# Patient Record
Sex: Male | Born: 1945 | Race: White | Marital: Married | Smoking: Former smoker
Health system: Southern US, Community
[De-identification: ages and names within clinical notes are randomized; demographics above are authoritative.]

## PROBLEM LIST (undated history)

## (undated) DIAGNOSIS — E7800 Pure hypercholesterolemia, unspecified: Secondary | ICD-10-CM

## (undated) DIAGNOSIS — E049 Nontoxic goiter, unspecified: Secondary | ICD-10-CM

## (undated) DIAGNOSIS — M1990 Unspecified osteoarthritis, unspecified site: Secondary | ICD-10-CM

## (undated) DIAGNOSIS — J449 Chronic obstructive pulmonary disease, unspecified: Secondary | ICD-10-CM

## (undated) DIAGNOSIS — I10 Essential (primary) hypertension: Secondary | ICD-10-CM

## (undated) HISTORY — PX: APPENDECTOMY: SHX54

## (undated) HISTORY — PX: BACK SURGERY: SHX140

---

## 2005-06-12 ENCOUNTER — Ambulatory Visit: Payer: Self-pay | Admitting: Gastroenterology

## 2007-03-12 ENCOUNTER — Ambulatory Visit: Payer: Self-pay | Admitting: Family Medicine

## 2007-11-27 ENCOUNTER — Ambulatory Visit: Payer: Self-pay | Admitting: Orthopedic Surgery

## 2007-12-04 ENCOUNTER — Ambulatory Visit: Payer: Self-pay | Admitting: Orthopedic Surgery

## 2008-10-05 ENCOUNTER — Ambulatory Visit: Payer: Self-pay | Admitting: Unknown Physician Specialty

## 2012-01-22 ENCOUNTER — Ambulatory Visit: Payer: Self-pay | Admitting: Specialist

## 2012-01-22 LAB — MRSA PCR SCREENING

## 2012-01-22 LAB — URINALYSIS, COMPLETE
Blood: NEGATIVE
Glucose,UR: NEGATIVE mg/dL (ref 0–75)
Leukocyte Esterase: NEGATIVE
Nitrite: NEGATIVE
Protein: NEGATIVE
Specific Gravity: 1.008 (ref 1.003–1.030)

## 2012-01-22 LAB — BASIC METABOLIC PANEL
Anion Gap: 8 (ref 7–16)
Calcium, Total: 9.8 mg/dL (ref 8.5–10.1)
Chloride: 106 mmol/L (ref 98–107)
Co2: 29 mmol/L (ref 21–32)
Creatinine: 1.09 mg/dL (ref 0.60–1.30)
EGFR (African American): >60
Osmolality: 284 (ref 275–301)

## 2012-01-22 LAB — CBC
HGB: 14 g/dL (ref 13.0–18.0)
MCH: 31.5 pg (ref 26.0–34.0)
MCV: 92 fL (ref 80–100)
RBC: 4.43 10*6/uL (ref 4.40–5.90)
WBC: 5.1 10*3/uL (ref 3.8–10.6)

## 2012-01-22 LAB — PROTIME-INR: Prothrombin Time: 12.7 secs (ref 11.5–14.7)

## 2012-01-30 ENCOUNTER — Inpatient Hospital Stay: Payer: Self-pay | Admitting: Specialist

## 2012-01-30 HISTORY — PX: OTHER SURGICAL HISTORY: SHX169

## 2012-01-31 LAB — CBC WITH DIFFERENTIAL/PLATELET
Eosinophil #: 0 10*3/uL (ref 0.0–0.7)
Eosinophil %: 0.4 %
HCT: 30.7 % — ABNORMAL LOW (ref 40.0–52.0)
Lymphocyte %: 14.7 %
MCV: 92 fL (ref 80–100)
Monocyte #: 0.9 10*3/uL — ABNORMAL HIGH (ref 0.0–0.7)
Monocyte %: 10.5 %
Platelet: 151 10*3/uL (ref 150–440)
RBC: 3.36 10*6/uL — ABNORMAL LOW (ref 4.40–5.90)
WBC: 8.3 10*3/uL (ref 3.8–10.6)

## 2012-01-31 LAB — BASIC METABOLIC PANEL
BUN: 13 mg/dL (ref 7–18)
Chloride: 105 mmol/L (ref 98–107)
Glucose: 118 mg/dL — ABNORMAL HIGH (ref 65–99)
Osmolality: 282 (ref 275–301)
Potassium: 3.6 mmol/L (ref 3.5–5.1)

## 2012-02-18 ENCOUNTER — Emergency Department: Payer: Self-pay | Admitting: Emergency Medicine

## 2012-02-18 LAB — URINALYSIS, COMPLETE
Bacteria: NONE SEEN
Bilirubin,UR: NEGATIVE
Blood: NEGATIVE
Leukocyte Esterase: NEGATIVE
Nitrite: NEGATIVE
RBC,UR: <1 /HPF (ref 0–5)
Squamous Epithelial: <1
WBC UR: 1 /HPF (ref 0–5)

## 2012-02-18 LAB — COMPREHENSIVE METABOLIC PANEL
Albumin: 3.7 g/dL (ref 3.4–5.0)
BUN: 15 mg/dL (ref 7–18)
Calcium, Total: 9.8 mg/dL (ref 8.5–10.1)
Chloride: 101 mmol/L (ref 98–107)
Co2: 23 mmol/L (ref 21–32)
Creatinine: 1.46 mg/dL — ABNORMAL HIGH (ref 0.60–1.30)
EGFR (Non-African Amer.): 51 — ABNORMAL LOW
Glucose: 102 mg/dL — ABNORMAL HIGH (ref 65–99)
Potassium: 4.1 mmol/L (ref 3.5–5.1)
SGOT(AST): 27 U/L (ref 15–37)
SGPT (ALT): 21 U/L

## 2012-02-18 LAB — CBC
HCT: 33 % — ABNORMAL LOW (ref 40.0–52.0)
HGB: 10.6 g/dL — ABNORMAL LOW (ref 13.0–18.0)
MCH: 29 pg (ref 26.0–34.0)
MCHC: 32 g/dL (ref 32.0–36.0)
MCV: 91 fL (ref 80–100)
Platelet: 658 10*3/uL — ABNORMAL HIGH (ref 150–440)
RBC: 3.64 10*6/uL — ABNORMAL LOW (ref 4.40–5.90)
RDW: 13.8 % (ref 11.5–14.5)
WBC: 5.1 10*3/uL (ref 3.8–10.6)

## 2012-02-18 LAB — TROPONIN I: Troponin-I: <0.02 ng/mL

## 2012-02-20 ENCOUNTER — Other Ambulatory Visit: Payer: Self-pay

## 2012-02-20 ENCOUNTER — Emergency Department (HOSPITAL_COMMUNITY)
Admission: EM | Admit: 2012-02-20 | Discharge: 2012-02-20 | Disposition: A | Discharge status: Home / Self Care | Payer: Medicare Other | Attending: Emergency Medicine | Admitting: Emergency Medicine

## 2012-02-20 ENCOUNTER — Emergency Department (HOSPITAL_COMMUNITY): Payer: Medicare Other

## 2012-02-20 ENCOUNTER — Encounter (HOSPITAL_COMMUNITY): Payer: Self-pay | Admitting: *Deleted

## 2012-02-20 DIAGNOSIS — R5381 Other malaise: Secondary | ICD-10-CM | POA: Insufficient documentation

## 2012-02-20 DIAGNOSIS — R5383 Other fatigue: Secondary | ICD-10-CM

## 2012-02-20 DIAGNOSIS — I10 Essential (primary) hypertension: Secondary | ICD-10-CM | POA: Insufficient documentation

## 2012-02-20 DIAGNOSIS — Z9889 Other specified postprocedural states: Secondary | ICD-10-CM | POA: Insufficient documentation

## 2012-02-20 DIAGNOSIS — R110 Nausea: Secondary | ICD-10-CM

## 2012-02-20 DIAGNOSIS — R0989 Other specified symptoms and signs involving the circulatory and respiratory systems: Secondary | ICD-10-CM | POA: Insufficient documentation

## 2012-02-20 DIAGNOSIS — R1011 Right upper quadrant pain: Secondary | ICD-10-CM | POA: Insufficient documentation

## 2012-02-20 DIAGNOSIS — R0602 Shortness of breath: Secondary | ICD-10-CM | POA: Insufficient documentation

## 2012-02-20 DIAGNOSIS — E7800 Pure hypercholesterolemia, unspecified: Secondary | ICD-10-CM | POA: Insufficient documentation

## 2012-02-20 DIAGNOSIS — R0600 Dyspnea, unspecified: Secondary | ICD-10-CM

## 2012-02-20 DIAGNOSIS — R509 Fever, unspecified: Secondary | ICD-10-CM | POA: Insufficient documentation

## 2012-02-20 DIAGNOSIS — R0609 Other forms of dyspnea: Secondary | ICD-10-CM | POA: Insufficient documentation

## 2012-02-20 HISTORY — DX: Essential (primary) hypertension: I10

## 2012-02-20 HISTORY — DX: Pure hypercholesterolemia, unspecified: E78.00

## 2012-02-20 LAB — URINALYSIS, ROUTINE W REFLEX MICROSCOPIC
Glucose, UA: NEGATIVE mg/dL
Hgb urine dipstick: NEGATIVE
Ketones, ur: 15 mg/dL — AB
Protein, ur: NEGATIVE mg/dL
Urobilinogen, UA: 0.2 mg/dL (ref 0.0–1.0)

## 2012-02-20 LAB — DIFFERENTIAL
Eosinophils Absolute: 0 10*3/uL (ref 0.0–0.7)
Lymphocytes Relative: 32 % (ref 12–46)
Lymphs Abs: 1.9 10*3/uL (ref 0.7–4.0)
Monocytes Relative: 8 % (ref 3–12)
Neutrophils Relative %: 59 % (ref 43–77)

## 2012-02-20 LAB — COMPREHENSIVE METABOLIC PANEL
ALT: 12 U/L (ref 0–53)
Alkaline Phosphatase: 97 U/L (ref 39–117)
BUN: 14 mg/dL (ref 6–23)
CO2: 22 mEq/L (ref 19–32)
GFR calc Af Amer: 73 mL/min — ABNORMAL LOW (ref >90)
GFR calc non Af Amer: 63 mL/min — ABNORMAL LOW (ref >90)
Glucose, Bld: 97 mg/dL (ref 70–99)
Potassium: 3.6 mEq/L (ref 3.5–5.1)
Total Bilirubin: 0.4 mg/dL (ref 0.3–1.2)
Total Protein: 7.8 g/dL (ref 6.0–8.3)

## 2012-02-20 LAB — OCCULT BLOOD, POC DEVICE: Fecal Occult Bld: NEGATIVE

## 2012-02-20 LAB — T4, FREE: Free T4: 0.82 ng/dL (ref 0.80–1.80)

## 2012-02-20 LAB — CBC
Hemoglobin: 10.3 g/dL — ABNORMAL LOW (ref 13.0–17.0)
MCH: 29.2 pg (ref 26.0–34.0)
MCV: 89.2 fL (ref 78.0–100.0)
RBC: 3.53 MIL/uL — ABNORMAL LOW (ref 4.22–5.81)
WBC: 5.9 10*3/uL (ref 4.0–10.5)

## 2012-02-20 LAB — LIPASE, BLOOD: Lipase: 52 U/L (ref 11–59)

## 2012-02-20 LAB — GLUCOSE, CAPILLARY: Glucose-Capillary: 94 mg/dL (ref 70–99)

## 2012-02-20 LAB — TSH: TSH: 3.031 u[IU]/mL (ref 0.350–4.500)

## 2012-02-20 LAB — POCT I-STAT TROPONIN I

## 2012-02-20 MED ORDER — IOHEXOL 300 MG/ML  SOLN
100.0000 mL | Freq: Once | INTRAMUSCULAR | Status: AC | PRN
  Administered 2012-02-20: 100 mL via INTRAVENOUS

## 2012-02-20 MED ORDER — ONDANSETRON HCL 4 MG/2ML IJ SOLN
4.0000 mg | Freq: Once | INTRAMUSCULAR | Status: AC
  Administered 2012-02-20: 4 mg via INTRAVENOUS
  Filled 2012-02-20: qty 2

## 2012-02-20 MED ORDER — SODIUM CHLORIDE 0.9 % IV BOLUS (SEPSIS)
1000.0000 mL | Freq: Once | INTRAVENOUS | Status: AC
  Administered 2012-02-20: 1000 mL via INTRAVENOUS

## 2012-02-20 MED ORDER — IOHEXOL 300 MG/ML  SOLN: 20.0000 mL | INTRAMUSCULAR | Status: AC

## 2012-02-20 NOTE — ED Provider Notes (Signed)
History     CSN: [id number]  Arrival date & time 02/20/12  1526   First MD Initiated Contact with Patient 02/20/12 1534      Chief Complaint  Patient presents with  . Shortness of Breath  . Fever    (Consider location/radiation/quality/duration/timing/severity/associated sxs/prior treatment) HPI History provided by the patient and the EMS report by the RN.  66 year old male with a history of hypertension, hypercholesterolemia, and recent left knee replacement presenting with complaint of fatigue and "I just Damione't feel well."  Patient states that his surgery on his left knee was about 3 weeks ago (on February 13). No known complications with the procedure; however, patient has felt fatigued and generally weak since that time. Symptoms have been gradual, progressive, namely worse in the past 1-2 weeks, constant, and severe. Patient specifically complains of fatigue, dyspnea on exertion, night sweats, chills, malaise, constant nausea, and decreased appetite. Patient reportedly had a low-grade fever sometime after surgery and was started on antibiotics for an unknown infection source. Patient did see his orthopedic surgeon in followup approximately 10 days after the procedure with reportedly unremarkable exam. Patient's orthopedist reportedly does not believe his symptoms are due to infection of his knee. Patient was then seen approximately 2 days ago at [facility] emergency Department and had a CT PE study at that time, which was without PE or pneumonia; possible pancreatic mass. Patient does have an appointment with his orthopedist tomorrow but states he could not wait until that appointment.  No history of similar in the past.  EMS reportedly with an initial blood pressure of 98/58 but all repeats around 150/77.  Glucose was 78 PTA.   Past Medical History  Diagnosis Date  . Hypertension   . Hypercholesteremia     Past Surgical History  Procedure Date  . Back surgery   . Appendectomy   .  Left knee surgery 01/30/12    No family history on file.  History  Substance Use Topics  . Smoking status: Former Games developer  . Smokeless tobacco: Not on file  . Alcohol Use: No      Review of Systems  Constitutional: Positive for chills, diaphoresis, appetite change, fatigue and unexpected weight change (Pt has not weighed himself, but states that his "britches are becoming too big."). Negative for fever.  HENT: Negative for congestion, sore throat and rhinorrhea.   Eyes: Negative for pain and visual disturbance.  Respiratory: Positive for shortness of breath. Negative for cough and wheezing.   Cardiovascular: Negative for chest pain and palpitations.  Gastrointestinal: Positive for nausea. Negative for vomiting, abdominal pain, diarrhea and blood in stool (Pt reports black stools but is on iron supplements.).  Genitourinary: Negative for dysuria, frequency and hematuria.  Musculoskeletal: Negative for back pain and gait problem.  Skin: Positive for wound (well-healing left knee surgical wound). Negative for rash.  Neurological: Positive for weakness (generalized). Negative for dizziness, syncope, speech difficulty, numbness and headaches.  Psychiatric/Behavioral: Negative for confusion and agitation. The patient is nervous/anxious (Pt states that "his nerves" specifically led him to be seen today.).   All other systems reviewed and are negative.    Allergies  Lactose intolerance (gi) and Penicillins  Home Medications   Current Outpatient Rx  Name Route Sig Dispense Refill  . ACETAMINOPHEN 500 MG PO TABS Oral Take 1,000 mg by mouth every 6 (six) hours as needed. For pain    . AMLODIPINE BESYLATE 5 MG PO TABS Oral Take 5 mg by mouth daily.    Marland Kitchen  CEPHALEXIN 500 MG PO CAPS Oral Take 500 mg by mouth every 8 (eight) hours.    . IRON 28 MG PO TABS Oral Take 1 tablet by mouth every morning.    Marland Kitchen GABAPENTIN 400 MG PO CAPS Oral Take 400 mg by mouth 2 (two) times daily.    Marland Kitchen  HYDROCODONE-ACETAMINOPHEN 7.5-325 MG PO TABS Oral Take 1 tablet by mouth every 6 (six) hours as needed. For pain    . ONDANSETRON HCL 4 MG PO TABS Oral Take 4 mg by mouth every 8 (eight) hours as needed. For nausea    . PRAVASTATIN SODIUM 40 MG PO TABS Oral Take 40 mg by mouth daily.    . SULFAMETHOXAZOLE-TMP DS 800-160 MG PO TABS Oral Take 1 tablet by mouth 2 (two) times daily.      BP 157/66  Pulse 75  Temp(Src) 97.9 F (36.6 C) (Oral)  Resp 16  SpO2 100%  Physical Exam  Nursing note and vitals reviewed. Constitutional: He is oriented to person, place, and time.       Alert, oriented, appears fatigued but in no acute distress, recently becoming upset/tearing when asked specifically why he presented today  HENT:  Head: Normocephalic and atraumatic.  Right Ear: External ear normal.  Left Ear: External ear normal.  Nose: Nose normal.  Mouth/Throat: Oropharynx is clear and moist.  Eyes: Conjunctivae and EOM are normal. Pupils are equal, round, and reactive to light.  Neck: Normal range of motion. Neck supple.  Cardiovascular: Normal rate, regular rhythm and intact distal pulses.   No murmur heard. Pulmonary/Chest: Effort normal and breath sounds normal. No respiratory distress.  Abdominal: Soft. Bowel sounds are normal. He exhibits no distension. There is tenderness (RUQ to Rt side mild TTP without Murphy's Sn.).  Genitourinary: Guaiac negative stool.  Musculoskeletal: Normal range of motion. He exhibits no edema.  Neurological: He is alert and oriented to person, place, and time.  Skin: Skin is dry. No rash noted. He is not diaphoretic.  Psychiatric: He has a normal mood and affect. Judgment normal.    ED Course  Procedures (including critical care time)   Date: 02/20/2012  Rate: 83  Rhythm: normal sinus rhythm  QRS Axis: normal  Intervals: normal  ST/T Wave abnormalities: normal  Conduction Disutrbances: none  Old EKG Reviewed:  No prior for comparison    Labs  Reviewed  CBC - Abnormal; Notable for the following:    RBC 3.53 (*)    Hemoglobin 10.3 (*)    HCT 31.5 (*)    Platelets 472 (*)    All other components within normal limits  COMPREHENSIVE METABOLIC PANEL - Abnormal; Notable for the following:    GFR calc non Af Amer 63 (*)    GFR calc Af Amer 73 (*)    All other components within normal limits  URINALYSIS, ROUTINE W REFLEX MICROSCOPIC - Abnormal; Notable for the following:    Color, Urine AMBER (*) BIOCHEMICALS MAY BE AFFECTED BY COLOR   Bilirubin Urine SMALL (*)    Ketones, ur 15 (*)    All other components within normal limits  DIFFERENTIAL  LIPASE, BLOOD  LACTIC ACID, PLASMA  OCCULT BLOOD, POC DEVICE  POCT I-STAT TROPONIN I  GLUCOSE, CAPILLARY  URINE CULTURE  TSH  T4, FREE   Dg Chest 2 View  02/20/2012  *RADIOLOGY REPORT*  Clinical Data: Short of breath, fever.  Knee replacement 01/30/2012  CHEST - 2 VIEW  Comparison: None.  Findings: Heart size and vascularity are  normal.  Linear density in the lingula appears to be due to scarring or atelectasis.  No definite pneumonia or effusion.  Apical pleural scarring bilaterally.  IMPRESSION: Lingular atelectasis or scarring.  Negative for pneumonia.  Original Report Authenticated By: Camelia Phenes, M.D.   Ct Abdomen Pelvis W Contrast  02/20/2012  *RADIOLOGY REPORT*  Clinical Data: The right upper quadrant to pain.  History of weight loss.  Generalized weakness.  Possible pancreatic mass.  CT ABDOMEN AND PELVIS WITH CONTRAST  Technique:  Multidetector CT imaging of the abdomen and pelvis was performed following the standard protocol during bolus administration of intravenous contrast.  Contrast: OMNIPAQUE IOHEXOL 300 MG/ML IJ SOLN  Comparison: None available.  Findings: The lung bases are clear without focal nodule, mass, or airspace disease.  The heart size is normal. No significant pleural or pericardial effusion is present.  The liver and spleen are within normal limits.  The  stomach, duodenum, and pancreas are unremarkable.  The common bile duct and gallbladder are normal.  The adrenal glands are normal bilaterally. The kidneys and ureters are within normal limits.  Atherosclerotic calcifications are present in the abdominal aorta branch vessels without aneurysm.  Diverticular changes are present within the sigmoid and descending colon without focal inflammation to suggest diverticulitis.  The remainder of the colon is unremarkable.  The appendix is not discretely visualized and may be surgically absent.  The small bowel is unremarkable.  There is no significant adenopathy or free fluid.  The patient is status post L4-5 PLIF.  Previous fusion is noted at L5-S1.  Endplate degenerative changes are noted.  No focal lytic or blastic lesions are evident.  Degenerative changes are noted at the hips bilaterally. Fat is herniated into the right inguinal canal without associated bowel.  IMPRESSION:  1.  No acute or focal abnormality to explain the patient's symptoms. 2.  Sigmoid and descending colonic diverticulosis without evidence for diverticulitis. 3.  Atherosclerosis. 4.  Postoperative changes of the lower lumbar spine.  Original Report Authenticated By: Jamesetta Orleans. MATTERN, M.D.       1. Fatigue       MDM  66 y.o. M recently s/p Lt knee replacement presenting with fatigue, nausea, suspected wt loss, and DOE.  No sig fever.  Recent CT PE study without intrathoracic path but possible pancreatic lesion/mass.  Exam as above, AF/VSS, mild Rt abd TTP, clear lungs, Lt knee with well-healing surgical scar and mildly decreased ROM reportedly the same since surgery and without sig pain elicited with ROM; doubt septic jt.  ACS, CA, metabolic derangement, or hypothyroidism possible, although the latter not expected to result in weight loss.  CT abd, labs, bolus, and zofran ordered.  EKG unremarkable.  Hemoccult neg.  4:32 PM - Called pt's PCP office (Dr. Marguerite Olea of [facility]); Dr. Marguerite Olea not availabe but spoke with the RN; pt's weight on 01/22/12 = 213 lb.  Labs at that time:  Hgb 14.1, creat 1.1, NL LFTs, and TSH 6.488; prior TSH 4.752; pt referred to endocrinology for further eval of possible hypothyroidism.  Pt with a h/o thyroid surgery, not on supplements.  Hgb now 10 but pt reportedly with know post-op anemia and on iron for this.  Labs otherwise WNL; TSH and free T4 pending.  CT without report of CA or acute path to explain pt's Sx's.  Will have pt f/u with PCP for thyroid results, with endocrinology as scheduled this month, and with ortho for repeat eval.  Return precautions reviewed.        Particia Lather, MD 02/21/12 [phone]

## 2012-02-20 NOTE — ED Notes (Signed)
Pt had dizziness with sitting and standing

## 2012-02-20 NOTE — ED Notes (Signed)
Pt is from home.  Pt had left total knee surgery on 01/30/12 and site doesnot appear red or swollen and no drainage.  Pt went home with low grade fever and was placed on anbx for knee.  Since pt is fatigued, exertional dyspnea, fever, night sweats, deccreased appetite.  Ems reported 2 bps  One sitting was 98/58 and then the rest have been 150/77.  cbg-78.  Pt has been anemic since the surgery and is taking an iron pill.  Pt had CT of chest to r/o PE on Monday and was told it was negative but that there was a mass on Pancreas.

## 2012-02-20 NOTE — ED Notes (Signed)
Patient to CT.

## 2012-02-20 NOTE — ED Provider Notes (Signed)
I saw and evaluated the patient, reviewed the resident's note and I agree with the findings and plan.  Pt seen and examined. Labs and xrays reviewed. Awaiting abd ct results. Anticipate admission  Toy Baker, MD 02/20/12 1723

## 2012-02-20 NOTE — ED Notes (Signed)
Patient continues with nausea.  MD notified.

## 2012-02-20 NOTE — ED Notes (Signed)
Checked patient Javier Good it was 51 notified RN Sabrina of blood sugar

## 2012-02-20 NOTE — Discharge Instructions (Signed)

## 2012-02-21 LAB — URINE CULTURE
Colony Count: NO GROWTH
Culture: NO GROWTH

## 2012-02-22 NOTE — ED Provider Notes (Signed)
I saw and evaluated the patient, reviewed the resident's note and I agree with the findings and plan.  Toy Baker, MD 02/22/12 [phone]

## 2012-02-28 ENCOUNTER — Ambulatory Visit: Payer: Self-pay | Admitting: Gastroenterology

## 2012-03-03 ENCOUNTER — Ambulatory Visit: Payer: Self-pay | Admitting: Gastroenterology

## 2012-03-12 ENCOUNTER — Ambulatory Visit: Payer: Self-pay | Admitting: Gastroenterology

## 2012-03-17 LAB — PATHOLOGY REPORT

## 2012-11-11 IMAGING — NM NUCLEAR MEDICINE HEPATOHBILIARY INCLUDE GB
2 series · 12 of 12 positions shown · non-contrast
Comparison: none

REASON FOR EXAM: abd pain RUQ nausea weight loss
COMMENTS:

[Series 1000: gallbladder dynamic · 4.80mm/px · 6 of 60 frames shown]
[frame 6/60]
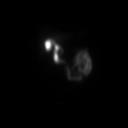
[frame 16/60]
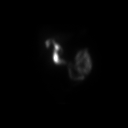
[frame 26/60]
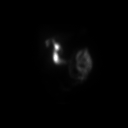
[frame 36/60]
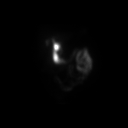
[frame 46/60]
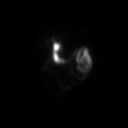
[frame 56/60]
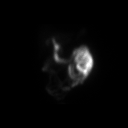

[Series 1000: gallbladder dynamic (results) · 4.80mm/px · 6 of 60 frames shown]
[frame 6/60]
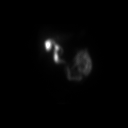
[frame 16/60]
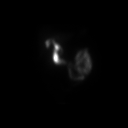
[frame 26/60]
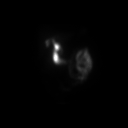
[frame 36/60]
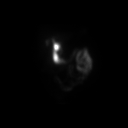
[frame 46/60]
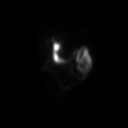
[frame 56/60]
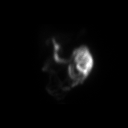

[12 of 12 positions shown; findings below may reference images not displayed]

PROCEDURE:     KNM - KNM HEPATO W/GB EJECT FRACTION  - March 03, 2012 [DATE]

RESULT:

Procedure: The patient received 8.57 mCi of technetium 99m labeled Choletec
intravenous administration. Hepatobiliary evaluation was performed with
imaging obtained of the abdomen in standard obliquities.

A gallbladder ejection fraction was measured status post intravenous
administration of 1.8 mcg of sincalide IV administered over 30 minutes.
FINDINGS: The liver demonstrates homogeneous radiotracer activity. Common
bile duct activity is identified at 10 minutes with gallbladder activity
appreciated at 15 minutes. Increased intensity is identified within the
gallbladder and common bile duct. Small bowel peristalsis and excretion is
identified at 75 minutes.

A gallbladder ejection fraction was measured at 98%.
IMPRESSION: Unremarkable hepatobiliary evaluation and gallbladder ejection fraction as
described above.

## 2013-10-23 ENCOUNTER — Ambulatory Visit: Payer: Self-pay | Admitting: Gastroenterology

## 2013-10-26 LAB — PATHOLOGY REPORT

## 2015-04-10 NOTE — Op Note (Signed)
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   PROCEDURE: Cemented left [place] LCS rotating platform total knee replacement (standard plus femur/patella, 12.5-mm polyethylene bearing, #4 keeled tibia).   SURGEON: Park Breed, M.D.   ASSISTANT: Thornton Park, M.D.   ANESTHESIA: General endotracheal plus femoral nerve block.   COMPLICATIONS: None.   DRAINS: Two Autovac.   ESTIMATED BLOOD LOSS: None.   REPLACED: None.   DESCRIPTION OF PROCEDURE: The patient was brought to the Operating Room where he underwent satisfactory general endotracheal anesthesia in the supine position.  He had a femoral nerve block on the left. He could not have a spinal due to prior back surgeries. The left leg was prepped and draped in sterile fashion and an Esmarch applied. The tourniquet was inflated to 350 mmHg. Tourniquet time was 100 minutes. Anterior midline incision was made and dissection carried out sharply through subcutaneous tissue. The capsule was incised and soft tissue dissection was carried out thoroughly. The proximal tibial alignment guide was inserted and pinned in appropriate position. The proximal tibial cut was made, taking more bone laterally than medially. The ligaments were released somewhat medially to balance the ligaments. Distal femur was sized as a standard plus. A centering hole was made and an anterior cutting block inserted. The rotation guide was inserted and stable at 12.5 mm with good alignment. The anterior cutting block was pinned in place and the anterior and posterior cuts were made. The distal femoral cutting block 4 degrees valgus was inserted and the distal femoral cuts made. Finishing block was applied and the cuts made. The tibia was approached and a  centering hole made. The trial was pinned in place and the keel inserted. The extension gap had been tested at 12.5 and was quite stable with good alignment. A 12.5 trial poly was inserted along with a standard plus femur. The knee articulated nicely and had good alignment and good motion and good stability. The patella was cut and drilled and trial inserted. This tracked well. The trials were all removed and the joint thoroughly irrigated, debrided, and dried while cement was mixed. The #4 keeled tibia was cemented in place along with a standard plus femur and patella. Excess cement was removed. A 12.5-mm rotating platform polyethylene was inserted. The knee articulated well with excellent alignment and stability. After all cemented had hardened, the wound was thoroughly irrigated again. Bone wax was placed on raw surfaces.  60 mL of 0.25% Marcaine with epinephrine, 4 mg of morphine, and 30 mg of Toradol was injected into the soft tissues. The Autovacs were inserted. The capsule was closed with #2 Ortho cord sutures. The subcutaneous tissue was closed with 2-0 Vicryl and skin was closed with staples. A dry sterile dressing was applied and Autovac was activated. The TENS pads, Polar Care, and knee immobilizer were applied. The patient was awakened and transferred to his hospital bed and taken to recovery in good condition.  ____________________________ Park Breed, MD hem:bjt D: 01/30/2012 10:17:38 ET T: 01/30/2012 11:31:42 ET JOB#: [id number]  cc: Park Breed, MD, <Dictator> Park Breed MD ELECTRONICALLY SIGNED 01/31/2012 13:29

## 2015-04-10 NOTE — H&P (Signed)
    Subjective/Chief Complaint Pain left knee    History of Present Illness 69 year old male has had progressive osteoarthritis of the left knee for several years.  Has failed injections and bracing.  Cannot take NSAIDs.  X-rays show advanced osteoarthritis of the knee with bone on bone medially.  Requests left total knee replacement.  Risks and benefits of surgery were discussed at length including but not limited to infection, non union, nerve or blood vessed damage, non union, need for repeat surgery, blood clots and lung emboli, and death.. Wife present and understands as well.   Past Med/Surgical Hx:  Arthritis:   Hypertension:   Right Knee Arthroscopy:   Left Knee Arthroscopy:   Lumbar Fusion:   Tonsillectomy:   Appendectomy:   ALLERGIES:  PCN: Hives  Milk: N/V/Diarrhea, Other  HOME MEDICATIONS: Medication Instructions Status  pravastatin 40 mg oral tablet 1 tab(s) orally once a day (at bedtime) Active  amlodipine 5 mg oral tablet 1 tab(s) orally once a day (in the morning) Active  aspirin 81 mg oral tablet 1 tab(s) orally once a day (at bedtime) Active  Tylenol Caplet Extra Strength 500 mg oral tablet 1 tab(s) orally every 6 hours as needed Active  Fish Oil 1200 mg oral capsule 2 cap(s) orally once a day Active  multivitamin 1 cap(s) orally once a day Active   Family and Social History:   Family History Non-Contributory    Social History negative tobacco    Place of Living Home   Review of Systems:   Fever/Chills No    Cough No    Sputum No    Abdominal Pain No   Physical Exam:   GEN WD, WN, NAD    HEENT pink conjunctivae    NECK supple    RESP normal resp effort    CARD regular rate    ABD denies tenderness    LYMPH negative neck    EXTR negative edema, Left knee in varus.  circulation/sensation/motor function good.  range of motion 5-100*.  Medial joint line pain.    SKIN normal to palpation    NEURO motor/sensory function intact    PSYCH  alert, A+O to time, place, person     Assessment/Admission Diagnosis Left  knee osteoarthritis    Plan Left total knee replacement   Electronic Signatures: Park Breed (MD)  (Signed 13-Feb-13 07:30)  Authored: CHIEF COMPLAINT and HISTORY, PAST MEDICAL/SURGIAL HISTORY, ALLERGIES, HOME MEDICATIONS, FAMILY AND SOCIAL HISTORY, REVIEW OF SYSTEMS, PHYSICAL EXAM, ASSESSMENT AND PLAN   Last Updated: 13-Feb-13 07:30 by Park Breed (MD)

## 2015-04-10 NOTE — Discharge Summary (Signed)
PATIENT NAME:  Javier Good, Javier Good MR#:  [id number] DATE OF BIRTH:  [birth year]  DATE OF ADMISSION:  01/30/2012 DATE OF DISCHARGE:  02/01/2012  FINAL DIAGNOSES:  1. Advanced osteoarthritis of left knee.  2. Hypertension.  3. High cholesterol.  PROCEDURE: On 01/30/2012 cemented left [place] rotating platform LCS total knee replacement.   COMPLICATIONS: None.   CONSULTATIONS: None.   DISCHARGE MEDICATIONS:  1. Norco 7.5/325 mg 1 to 2 every six hours p.r.n. pain.  2. Enteric-coated aspirin one p.o. twice a day. 3. Neurontin 400 mg twice a day. 4. Home medications as prior to admission.   HISTORY OF PRESENT ILLNESS: The patient is 69 year old male with advanced osteoarthritis of the left knee. He has been followed for some time with injections and bracing. He cannot take NSAIDs. He has reached the point where he has daily pain and difficulty with activities of daily living and sleep. He requested total knee replacement. The risks and benefits and postoperative protocols were discussed with the patient and his wife at length.   PAST MEDICAL HISTORY: As above.   PAST SURGICAL HISTORY:  1. Arthroscopy, both knees. 2. Lumbar fusion. 3. Tonsillectomy.  4. Appendectomy.   ALLERGIES: Penicillin causes hives. Milk causes diarrhea.  HOME MEDICATIONS: 1. Pravastatin 40 mg daily. 2. Amlodipine 5 mg daily. 3. Aspirin 81 mg daily. 4. Tylenol as needed. 5. Fish oil. 6. Vitamins.  FAMILY HISTORY: Unremarkable.   SOCIAL HISTORY: The patient does not smoke. He lives at home with his wife.  REVIEW OF SYSTEMS: Unremarkable.   PHYSICAL EXAMINATION: Vital signs were normal. The patient was alert and cooperative and fully oriented. The left knee showed tenderness medially with spurring. There was motion from 5 to 100 degrees. The knee was stable. There was minimal effusion. Neurovascular status was good distally and the skin was intact.   LABORATORY DATA: Laboratory data on admission was  satisfactory.   HOSPITAL COURSE: On 01/30/2012, the patient underwent cemented left total knee replacement. He did well postoperatively with no significant pain or problems. The hemoglobin remained stable and was 9.5 on the second postoperative day. He was ambulatory and pain was controlled. He was discharged on 02/01/2012 to home with partial weight-bearing on the left leg. He will get home health physical therapy. He will be seen in my office in 10 days to 2 weeks for exam.  ____________________________ Park Breed, MD hem:slb D: 02/07/2012 10:30:00 ET     T: 02/07/2012 11:54:26 ET         JOB#: [id number] cc: Fara Olden B. Jacqualine Code, MD Park Breed, MD, <Dictator> Park Breed MD ELECTRONICALLY SIGNED 02/08/2012 10:16

## 2017-04-01 ENCOUNTER — Other Ambulatory Visit: Payer: Self-pay | Admitting: Internal Medicine

## 2017-04-01 DIAGNOSIS — S0990XS Unspecified injury of head, sequela: Secondary | ICD-10-CM

## 2017-04-04 ENCOUNTER — Ambulatory Visit
Admission: RE | Admit: 2017-04-04 | Discharge: 2017-04-04 | Disposition: A | Discharge status: Home / Self Care | Payer: Medicare Other | Source: Ambulatory Visit | Attending: Internal Medicine | Admitting: Internal Medicine

## 2017-04-04 DIAGNOSIS — S0990XS Unspecified injury of head, sequela: Secondary | ICD-10-CM | POA: Diagnosis not present

## 2017-04-04 DIAGNOSIS — X58XXXS Exposure to other specified factors, sequela: Secondary | ICD-10-CM | POA: Diagnosis not present

## 2019-01-09 ENCOUNTER — Encounter: Payer: Self-pay | Admitting: *Deleted

## 2019-01-12 ENCOUNTER — Ambulatory Visit: Payer: Medicare HMO | Admitting: Anesthesiology

## 2019-01-12 ENCOUNTER — Ambulatory Visit
Admission: RE | Admit: 2019-01-12 | Discharge: 2019-01-12 | Disposition: A | Discharge status: Home / Self Care | Payer: Medicare HMO | Attending: Gastroenterology | Admitting: Gastroenterology

## 2019-01-12 ENCOUNTER — Encounter
Admission: RE | Disposition: A | Discharge status: Home / Self Care | Payer: Self-pay | Source: Home / Self Care | Attending: Gastroenterology

## 2019-01-12 ENCOUNTER — Encounter: Payer: Self-pay | Admitting: Anesthesiology

## 2019-01-12 DIAGNOSIS — E7800 Pure hypercholesterolemia, unspecified: Secondary | ICD-10-CM | POA: Diagnosis not present

## 2019-01-12 DIAGNOSIS — I10 Essential (primary) hypertension: Secondary | ICD-10-CM | POA: Insufficient documentation

## 2019-01-12 DIAGNOSIS — Z86010 Personal history of colonic polyps: Secondary | ICD-10-CM | POA: Diagnosis not present

## 2019-01-12 DIAGNOSIS — D123 Benign neoplasm of transverse colon: Secondary | ICD-10-CM | POA: Insufficient documentation

## 2019-01-12 DIAGNOSIS — Z1211 Encounter for screening for malignant neoplasm of colon: Secondary | ICD-10-CM | POA: Diagnosis not present

## 2019-01-12 DIAGNOSIS — K641 Second degree hemorrhoids: Secondary | ICD-10-CM | POA: Insufficient documentation

## 2019-01-12 DIAGNOSIS — E739 Lactose intolerance, unspecified: Secondary | ICD-10-CM | POA: Insufficient documentation

## 2019-01-12 DIAGNOSIS — E049 Nontoxic goiter, unspecified: Secondary | ICD-10-CM | POA: Insufficient documentation

## 2019-01-12 DIAGNOSIS — J449 Chronic obstructive pulmonary disease, unspecified: Secondary | ICD-10-CM | POA: Diagnosis not present

## 2019-01-12 DIAGNOSIS — Z79899 Other long term (current) drug therapy: Secondary | ICD-10-CM | POA: Insufficient documentation

## 2019-01-12 DIAGNOSIS — Z880 Allergy status to penicillin: Secondary | ICD-10-CM | POA: Diagnosis not present

## 2019-01-12 DIAGNOSIS — M1990 Unspecified osteoarthritis, unspecified site: Secondary | ICD-10-CM | POA: Diagnosis not present

## 2019-01-12 DIAGNOSIS — Z87891 Personal history of nicotine dependence: Secondary | ICD-10-CM | POA: Diagnosis not present

## 2019-01-12 DIAGNOSIS — K621 Rectal polyp: Secondary | ICD-10-CM | POA: Insufficient documentation

## 2019-01-12 HISTORY — DX: Nontoxic goiter, unspecified: E04.9

## 2019-01-12 HISTORY — DX: Unspecified osteoarthritis, unspecified site: M19.90

## 2019-01-12 HISTORY — PX: COLONOSCOPY WITH PROPOFOL: SHX5780

## 2019-01-12 HISTORY — DX: Chronic obstructive pulmonary disease, unspecified: J44.9

## 2019-01-12 SURGERY — COLONOSCOPY WITH PROPOFOL
Anesthesia: General

## 2019-01-12 MED ORDER — MIDAZOLAM HCL 2 MG/2ML IJ SOLN
INTRAMUSCULAR | Status: AC
  Filled 2019-01-12: qty 2

## 2019-01-12 MED ORDER — PROPOFOL 10 MG/ML IV BOLUS
INTRAVENOUS | Status: DC | PRN
  Administered 2019-01-12: 50 mg via INTRAVENOUS
  Administered 2019-01-12: 30 mg via INTRAVENOUS

## 2019-01-12 MED ORDER — PROPOFOL 500 MG/50ML IV EMUL
INTRAVENOUS | Status: DC | PRN
  Administered 2019-01-12: 150 ug/kg/min via INTRAVENOUS

## 2019-01-12 MED ORDER — SODIUM CHLORIDE 0.9 % IV SOLN: INTRAVENOUS | Status: DC

## 2019-01-12 MED ORDER — FENTANYL CITRATE (PF) 100 MCG/2ML IJ SOLN
INTRAMUSCULAR | Status: AC
  Filled 2019-01-12: qty 2

## 2019-01-12 MED ORDER — SODIUM CHLORIDE 0.9 % IV SOLN
INTRAVENOUS | Status: DC
  Administered 2019-01-12: 1000 mL via INTRAVENOUS

## 2019-01-12 MED ORDER — LIDOCAINE HCL (PF) 2 % IJ SOLN
INTRAMUSCULAR | Status: AC
  Filled 2019-01-12: qty 10

## 2019-01-12 MED ORDER — PHENYLEPHRINE HCL 10 MG/ML IJ SOLN
INTRAMUSCULAR | Status: DC | PRN
  Administered 2019-01-12 (×3): 100 ug via INTRAVENOUS

## 2019-01-12 MED ORDER — FENTANYL CITRATE (PF) 100 MCG/2ML IJ SOLN
INTRAMUSCULAR | Status: DC | PRN
  Administered 2019-01-12: 25 ug via INTRAVENOUS

## 2019-01-12 MED ORDER — PROPOFOL 10 MG/ML IV BOLUS
INTRAVENOUS | Status: AC
  Filled 2019-01-12: qty 20

## 2019-01-12 MED ORDER — PROPOFOL 500 MG/50ML IV EMUL
INTRAVENOUS | Status: AC
  Filled 2019-01-12: qty 50

## 2019-01-12 MED ORDER — EPHEDRINE SULFATE 50 MG/ML IJ SOLN
INTRAMUSCULAR | Status: AC
  Filled 2019-01-12: qty 1

## 2019-01-12 MED ORDER — EPHEDRINE SULFATE 50 MG/ML IJ SOLN
INTRAMUSCULAR | Status: DC | PRN
  Administered 2019-01-12 (×2): 10 mg via INTRAVENOUS

## 2019-01-12 NOTE — Anesthesia Postprocedure Evaluation (Signed)
Anesthesia Post Note  Patient: Javier Good  Procedure(s) Performed: COLONOSCOPY WITH PROPOFOL (N/A )  Patient location during evaluation: PACU Anesthesia Type: General Level of consciousness: awake and alert and oriented Pain management: pain level controlled Vital Signs Assessment: post-procedure vital signs reviewed and stable Respiratory status: spontaneous breathing Cardiovascular status: blood pressure returned to baseline Anesthetic complications: no     Last Vitals:  Vitals:   01/12/19 0854 01/12/19 0907  BP: 125/63 128/62  Pulse:    Resp:    Temp:    SpO2:      Last Pain:  Vitals:   01/12/19 0923  TempSrc:   PainSc: 0-No pain                 Sura Canul

## 2019-01-12 NOTE — H&P (Signed)
Outpatient short stay form Pre-procedure 01/12/2019 7:29 AM Lollie Sails MD  Primary Physician: Dr. Harrel Lemon  Reason for visit: Colonoscopy  History of present illness: Patient is a 73 year old male presenting today for a colonoscopy in regards to his personal history of colon polyps.  His last colonoscopy was 10/23/2013.  He tolerated his prep well.  He takes no aspirin or blood thinning agent.    Current Facility-Administered Medications:  .  0.9 %  sodium chloride infusion, , Intravenous, Continuous, Lollie Sails, MD, Last Rate: 20 mL/hr at 01/12/19 0706, 1,000 mL at 01/12/19 0706 .  0.9 %  sodium chloride infusion, , Intravenous, Continuous, Lollie Sails, MD  Medications Prior to Admission  Medication Sig Dispense Refill Last Dose  . acetaminophen (TYLENOL) 500 MG tablet Take 1,000 mg by mouth every 6 (six) hours as needed. For pain   01/11/2019 at Unknown time  . amLODipine (NORVASC) 5 MG tablet Take 5 mg by mouth daily.   01/12/2019 at 0430  . cephALEXin (KEFLEX) 500 MG capsule Take 500 mg by mouth every 8 (eight) hours.   01/11/2019 at Unknown time  . ondansetron (ZOFRAN) 4 MG tablet Take 4 mg by mouth every 8 (eight) hours as needed. For nausea   Past Week at Unknown time  . pravastatin (PRAVACHOL) 40 MG tablet Take 40 mg by mouth daily.   01/11/2019 at Unknown time  . Ferrous Sulfate (IRON) 28 MG TABS Take 1 tablet by mouth every morning.   Completed Course at Unknown time  . gabapentin (NEURONTIN) 400 MG capsule Take 400 mg by mouth 2 (two) times daily.   Completed Course at Unknown time  . HYDROcodone-acetaminophen (NORCO) 7.5-325 MG per tablet Take 1 tablet by mouth every 6 (six) hours as needed. For pain   Completed Course at Unknown time  . sulfamethoxazole-trimethoprim (BACTRIM DS) 800-160 MG per tablet Take 1 tablet by mouth 2 (two) times daily.   Completed Course at Unknown time     Allergies  Allergen Reactions  . Lactose Intolerance (Gi)   .  Penicillins Other (See Comments)    unknown     Past Medical History:  Diagnosis Date  . Arthritis    osteo  . COPD (chronic obstructive pulmonary disease) ([place])   . Hypercholesteremia   . Hypertension   . Thyroid goiter     Review of systems:      Physical Exam    Heart and lungs: Regular rate and rhythm without rub or gallop, lungs are bilaterally clear.    HEENT: Normocephalic atraumatic eyes are anicteric    Other:    Pertinant exam for procedure: Soft nontender nondistended bowel sounds positive normoactive    Planned proceedures: Colonoscopy and indicated procedures. I have discussed the risks benefits and complications of procedures to include not limited to bleeding, infection, perforation and the risk of sedation and the patient wishes to proceed.    Lollie Sails, MD Gastroenterology 01/12/2019  7:29 AM

## 2019-01-12 NOTE — Anesthesia Post-op Follow-up Note (Signed)
Anesthesia QCDR form completed.        

## 2019-01-12 NOTE — Op Note (Signed)
[facility] Gastroenterology Patient Name: Javier Good Procedure Date: 01/12/2019 7:33 AM MRN: [id number] Account #: [id number] Date of Birth: 1946/10/16 Admit Type: Outpatient Age: 73 Room: [facility] ENDO ROOM 3 Gender: Male Note Status: Finalized Procedure:            Colonoscopy Indications:          Personal history of colonic polyps Providers:            Lollie Sails, MD Referring MD:         Baxter Hire, MD (Referring MD) Medicines:            Monitored Anesthesia Care Complications:        No immediate complications. Procedure:            Pre-Anesthesia Assessment:                       - ASA Grade Assessment: III - A patient with severe                        systemic disease.                       After obtaining informed consent, the colonoscope was                        passed under direct vision. Throughout the procedure,                        the patient's blood pressure, pulse, and oxygen                        saturations were monitored continuously. The                        Colonoscope was introduced through the anus and                        advanced to the the cecum, identified by appendiceal                        orifice and ileocecal valve. The quality of the bowel                        preparation was good. Findings:      A 2 mm polyp was found in the rectum. The polyp was sessile. The polyp       was removed with a cold biopsy forceps. Resection and retrieval were       complete.      Three sessile polyps were found in the splenic flexure. The polyps were       3 to 6 mm in size. These polyps were removed with a cold snare and cold       forcep. Resection and retrieval were complete.      Many small and large-mouthed diverticula were found in the sigmoid       colon, descending colon, transverse colon and ascending colon.      External and internal hemorrhoids were found during retroflexion, during       digital exam and  during anoscopy. The hemorrhoids were small and Grade       II (internal hemorrhoids that prolapse but  reduce spontaneously). Impression:           - One 2 mm polyp in the rectum, removed with a cold                        biopsy forceps. Resected and retrieved.                       - Three 3 to 6 mm polyps at the splenic flexure,                        removed with a cold snare. Resected and retrieved. Recommendation:       - Await pathology results.                       - Telephone GI clinic for pathology results in 1 week. Procedure Code(s):    --- Professional ---                       [phone], Colonoscopy, flexible; with removal of tumor(s),                        polyp(s), or other lesion(s) by snare technique                       45380, 82, Colonoscopy, flexible; with biopsy, single                        or multiple Diagnosis Code(s):    --- Professional ---                       K62.1, Rectal polyp                       D12.3, Benign neoplasm of transverse colon (hepatic                        flexure or splenic flexure)                       Z86.010, Personal history of colonic polyps CPT copyright 2018 [employer]. All rights reserved. The codes documented in this report are preliminary and upon coder review may  be revised to meet current compliance requirements. Lollie Sails, MD 01/12/2019 8:15:59 AM This report has been signed electronically. Number of Addenda: 0 Note Initiated On: 01/12/2019 7:33 AM Scope Withdrawal Time: 0 hours 18 minutes 57 seconds  Total Procedure Duration: 0 hours 28 minutes 59 seconds       [facility]

## 2019-01-12 NOTE — Transfer of Care (Signed)
Immediate Anesthesia Transfer of Care Note  Patient: Javier Good  Procedure(s) Performed: COLONOSCOPY WITH PROPOFOL (N/A )  Patient Location: PACU  Anesthesia Type:General  Level of Consciousness:   Airway & Oxygen Therapy: Patient Spontanous Breathing and Patient connected to nasal cannula oxygen  Post-op Assessment: Report given to RN and Post -op Vital signs reviewed and stable  Post vital signs: Reviewed and stable  Last Vitals:  Vitals Value Taken Time  BP 86/47 01/12/2019  8:19 AM  Temp 36.1 C 01/12/2019  8:18 AM  Pulse 70 01/12/2019  8:20 AM  Resp 11 01/12/2019  8:20 AM  SpO2 99 % 01/12/2019  8:20 AM  Vitals shown include unvalidated device data.  Last Pain:  Vitals:   01/12/19 0818  TempSrc: Tympanic  PainSc: 0-No pain         Complications: No apparent anesthesia complications

## 2019-01-12 NOTE — Anesthesia Preprocedure Evaluation (Addendum)
Anesthesia Evaluation  Patient identified by MRN, date of birth, ID band Patient awake    Reviewed: Allergy & Precautions, NPO status , Patient's Chart, lab work & pertinent test results  Airway Mallampati: III  TM Distance: <3 FB     Dental  (+) Teeth Intact, Caps   Pulmonary COPD, former smoker,    Pulmonary exam normal        Cardiovascular hypertension, Pt. on medications Normal cardiovascular exam     Neuro/Psych negative psych ROS   GI/Hepatic negative GI ROS, Neg liver ROS,   Endo/Other    Renal/GU negative Renal ROS  negative genitourinary   Musculoskeletal  (+) Arthritis , Osteoarthritis,    Abdominal Normal abdominal exam  (+)   Peds negative pediatric ROS (+)  Hematology negative hematology ROS (+)   Anesthesia Other Findings Past Medical History: No date: Arthritis     Comment:  osteo No date: COPD (chronic obstructive pulmonary disease) ([facility]) No date: Hypercholesteremia No date: Hypertension No date: Thyroid goiter  Reproductive/Obstetrics                            Anesthesia Physical Anesthesia Plan  ASA: III  Anesthesia Plan: General   Post-op Pain Management:    Induction: Intravenous  PONV Risk Score and Plan: TIVA  Airway Management Planned: Nasal Cannula  Additional Equipment:   Intra-op Plan:   Post-operative Plan:   Informed Consent: I have reviewed the patients History and Physical, chart, labs and discussed the procedure including the risks, benefits and alternatives for the proposed anesthesia with the patient or authorized representative who has indicated his/her understanding and acceptance.     Dental advisory given  Plan Discussed with: CRNA and Surgeon  Anesthesia Plan Comments:         Anesthesia Quick Evaluation

## 2019-01-14 ENCOUNTER — Encounter: Payer: Self-pay | Admitting: Gastroenterology

## 2019-01-14 LAB — SURGICAL PATHOLOGY

## 2021-08-15 ENCOUNTER — Other Ambulatory Visit: Payer: Self-pay

## 2021-08-15 ENCOUNTER — Encounter: Payer: Self-pay | Admitting: Podiatry

## 2021-08-15 ENCOUNTER — Ambulatory Visit (INDEPENDENT_AMBULATORY_CARE_PROVIDER_SITE_OTHER): Payer: Medicare HMO

## 2021-08-15 ENCOUNTER — Ambulatory Visit: Payer: Medicare HMO | Admitting: Podiatry

## 2021-08-15 DIAGNOSIS — M722 Plantar fascial fibromatosis: Secondary | ICD-10-CM

## 2021-08-15 NOTE — Progress Notes (Signed)
[name]  Subjective:  Patient ID: Javier Good, male    DOB: 07-24-1946,  MRN: [id number]  Chief Complaint  Patient presents with   Foot Pain    Left heel pain     75 y.o. male presents with the above complaint.  Patient presents with complaint left heel pain that has been going on for quite some time.  Patient states when walking causing discomfort.  He is got 8 or 9 out of 10scale.  Is been on for few months has progressed to gotten worse.  He has not seen MRIs prior to seeing me.  He denies any other acute complaints.  He had history of Planter fasciitis for which he was treated with injection.   Review of Systems: Negative except as noted in the HPI. Denies N/V/F/Ch.  Past Medical History:  Diagnosis Date   Arthritis    osteo   COPD (chronic obstructive pulmonary disease) ([facility])    Hypercholesteremia    Hypertension    Thyroid goiter     Current Outpatient Medications:    acetaminophen (TYLENOL) 500 MG tablet, Take 1,000 mg by mouth every 6 (six) hours as needed. For pain, Disp: , Rfl:    amLODipine (NORVASC) 5 MG tablet, Take 5 mg by mouth daily., Disp: , Rfl:    cephALEXin (KEFLEX) 500 MG capsule, Take 500 mg by mouth every 8 (eight) hours., Disp: , Rfl:    Ferrous Sulfate (IRON) 28 MG TABS, Take 1 tablet by mouth every morning., Disp: , Rfl:    gabapentin (NEURONTIN) 400 MG capsule, Take 400 mg by mouth 2 (two) times daily., Disp: , Rfl:    HYDROcodone-acetaminophen (NORCO) 7.5-325 MG per tablet, Take 1 tablet by mouth every 6 (six) hours as needed. For pain, Disp: , Rfl:    ondansetron (ZOFRAN) 4 MG tablet, Take 4 mg by mouth every 8 (eight) hours as needed. For nausea, Disp: , Rfl:    pravastatin (PRAVACHOL) 40 MG tablet, Take 40 mg by mouth daily., Disp: , Rfl:    sulfamethoxazole-trimethoprim (BACTRIM DS) 800-160 MG per tablet, Take 1 tablet by mouth 2 (two) times daily., Disp: , Rfl:   Social History   Tobacco Use  Smoking Status Former  Smokeless Tobacco Not on file     Allergies  Allergen Reactions   Lactose Intolerance (Gi)    Penicillins Other (See Comments)    unknown   Objective:  There were no vitals filed for this visit. There is no height or weight on file to calculate BMI. Constitutional Well developed. Well nourished.  Vascular Dorsalis pedis pulses palpable bilaterally. Posterior tibial pulses palpable bilaterally. Capillary refill normal to all digits.  No cyanosis or clubbing noted. Pedal hair growth normal.  Neurologic Normal speech. Oriented to person, place, and time. Epicritic sensation to light touch grossly present bilaterally.  Dermatologic Nails well groomed and normal in appearance. No open wounds. No skin lesions.  Orthopedic: Normal joint ROM without pain or crepitus bilaterally. No visible deformities. Tender to palpation at the calcaneal tuber left. No pain with calcaneal squeeze left. Ankle ROM diminished range of motion left. Silfverskiold Test: positive left.   Radiographs: Taken and reviewed. No acute fractures or dislocations. No evidence of stress fracture.  Plantar heel spur present. Posterior heel spur present.   Assessment:   1. Plantar fasciitis of left foot    Plan:  Patient was evaluated and treated and all questions answered.  Plantar Fasciitis, left - XR reviewed as above.  - Educated on  icing and stretching. Instructions given.  - Injection delivered to the plantar fascia as below. - DME: Plantar Fascial Brace - Pharmacologic management: None  Procedure: Injection Tendon/Ligament Location: Left plantar fascia at the glabrous junction; medial approach. Skin Prep: alcohol Injectate: 0.5 cc 0.5% marcaine plain, 0.5 cc of 1% Lidocaine, 0.5 cc kenalog 10. Disposition: Patient tolerated procedure well. Injection site dressed with a band-aid.  No follow-ups on file.

## 2021-09-19 ENCOUNTER — Other Ambulatory Visit: Payer: Self-pay

## 2021-09-19 ENCOUNTER — Ambulatory Visit (INDEPENDENT_AMBULATORY_CARE_PROVIDER_SITE_OTHER): Payer: Medicare HMO | Admitting: Podiatry

## 2021-09-19 ENCOUNTER — Encounter: Payer: Self-pay | Admitting: Podiatry

## 2021-09-19 ENCOUNTER — Encounter (INDEPENDENT_AMBULATORY_CARE_PROVIDER_SITE_OTHER): Payer: Self-pay

## 2021-09-19 DIAGNOSIS — M722 Plantar fascial fibromatosis: Secondary | ICD-10-CM

## 2021-09-19 NOTE — Progress Notes (Signed)
[name]  Subjective:  Patient ID: Javier Good, male    DOB: 03/26/1946,  MRN: [id number]  Chief Complaint  Patient presents with   Plantar Fasciitis    "It still bothers me in the mornings but its a lot better"    75 y.o. male presents with the above complaint.  Patient presents for follow-up to left Planter fasciitis.  Patient states is about 70 to 80% better.  He states he still hurts with ambulation.  He denies any other acute complaints her first injection helped considerably.  The brace did not help.  He would like to discuss other treatment options.   Review of Systems: Negative except as noted in the HPI. Denies N/V/F/Ch.  Past Medical History:  Diagnosis Date   Arthritis    osteo   COPD (chronic obstructive pulmonary disease) ([facility])    Hypercholesteremia    Hypertension    Thyroid goiter     Current Outpatient Medications:    acetaminophen (TYLENOL) 500 MG tablet, Take 1,000 mg by mouth every 6 (six) hours as needed. For pain, Disp: , Rfl:    amLODipine (NORVASC) 5 MG tablet, Take 5 mg by mouth daily., Disp: , Rfl:    cephALEXin (KEFLEX) 500 MG capsule, Take 500 mg by mouth every 8 (eight) hours., Disp: , Rfl:    Ferrous Sulfate (IRON) 28 MG TABS, Take 1 tablet by mouth every morning., Disp: , Rfl:    gabapentin (NEURONTIN) 400 MG capsule, Take 400 mg by mouth 2 (two) times daily., Disp: , Rfl:    HYDROcodone-acetaminophen (NORCO) 7.5-325 MG per tablet, Take 1 tablet by mouth every 6 (six) hours as needed. For pain, Disp: , Rfl:    ondansetron (ZOFRAN) 4 MG tablet, Take 4 mg by mouth every 8 (eight) hours as needed. For nausea, Disp: , Rfl:    pravastatin (PRAVACHOL) 40 MG tablet, Take 40 mg by mouth daily., Disp: , Rfl:    sulfamethoxazole-trimethoprim (BACTRIM DS) 800-160 MG per tablet, Take 1 tablet by mouth 2 (two) times daily., Disp: , Rfl:   Social History   Tobacco Use  Smoking Status Former  Smokeless Tobacco Not on file    Allergies  Allergen Reactions    Lactose Other (See Comments)   Lactose Intolerance (Gi)    Penicillins Other (See Comments)    unknown   Objective:  There were no vitals filed for this visit. There is no height or weight on file to calculate BMI. Constitutional Well developed. Well nourished.  Vascular Dorsalis pedis pulses palpable bilaterally. Posterior tibial pulses palpable bilaterally. Capillary refill normal to all digits.  No cyanosis or clubbing noted. Pedal hair growth normal.  Neurologic Normal speech. Oriented to person, place, and time. Epicritic sensation to light touch grossly present bilaterally.  Dermatologic Nails well groomed and normal in appearance. No open wounds. No skin lesions.  Orthopedic: Normal joint ROM without pain or crepitus bilaterally. No visible deformities. Tender to palpation at the calcaneal tuber left. No pain with calcaneal squeeze left. Ankle ROM diminished range of motion left. Silfverskiold Test: positive left.   Radiographs: Taken and reviewed. No acute fractures or dislocations. No evidence of stress fracture.  Plantar heel spur present. Posterior heel spur present.   Assessment:   1. Plantar fasciitis of left foot     Plan:  Patient was evaluated and treated and all questions answered.  Plantar Fasciitis, left - XR reviewed as above.  - Educated on icing and stretching. Instructions given.  -Second injection delivered  to the plantar fascia as below. - DME: Plantar Fascial Brace - Pharmacologic management: None -I discussed with the patient power steps insoles for prevention.  Procedure: Injection Tendon/Ligament Location: Left plantar fascia at the glabrous junction; medial approach. Skin Prep: alcohol Injectate: 0.5 cc 0.5% marcaine plain, 0.5 cc of 1% Lidocaine, 0.5 cc kenalog 10. Disposition: Patient tolerated procedure well. Injection site dressed with a band-aid.  No follow-ups on file.

## 2021-10-13 DIAGNOSIS — Z23 Encounter for immunization: Secondary | ICD-10-CM

## 2021-10-17 ENCOUNTER — Encounter: Payer: Self-pay | Admitting: Podiatry

## 2021-10-17 ENCOUNTER — Other Ambulatory Visit: Payer: Self-pay

## 2021-10-17 ENCOUNTER — Ambulatory Visit: Payer: Medicare HMO | Admitting: Podiatry

## 2021-10-17 DIAGNOSIS — M722 Plantar fascial fibromatosis: Secondary | ICD-10-CM | POA: Diagnosis not present

## 2021-10-17 NOTE — Progress Notes (Signed)
[name]  Subjective:  Patient ID: Javier Good, male    DOB: 04-03-1946,  MRN: [id number]  Chief Complaint  Patient presents with   Plantar Fasciitis    Pt stated that he has had some pain since his last injection     75 y.o. male presents with the above complaint.  Patient presents for follow-up to left Planter fasciitis.  Patient states is about 80% to 85% better.  He states he still hurts with ambulation.  He denies any other acute complaints her second injection helped considerably.  He still has some residual pain the brace did not help.  He would like to discuss other treatment options.   Review of Systems: Negative except as noted in the HPI. Denies N/V/F/Ch.  Past Medical History:  Diagnosis Date   Arthritis    osteo   COPD (chronic obstructive pulmonary disease) ([facility])    Hypercholesteremia    Hypertension    Thyroid goiter     Current Outpatient Medications:    acetaminophen (TYLENOL) 500 MG tablet, Take 1,000 mg by mouth every 6 (six) hours as needed. For pain, Disp: , Rfl:    amLODipine (NORVASC) 5 MG tablet, Take 5 mg by mouth daily., Disp: , Rfl:    cephALEXin (KEFLEX) 500 MG capsule, Take 500 mg by mouth every 8 (eight) hours., Disp: , Rfl:    Ferrous Sulfate (IRON) 28 MG TABS, Take 1 tablet by mouth every morning., Disp: , Rfl:    gabapentin (NEURONTIN) 400 MG capsule, Take 400 mg by mouth 2 (two) times daily., Disp: , Rfl:    HYDROcodone-acetaminophen (NORCO) 7.5-325 MG per tablet, Take 1 tablet by mouth every 6 (six) hours as needed. For pain, Disp: , Rfl:    ondansetron (ZOFRAN) 4 MG tablet, Take 4 mg by mouth every 8 (eight) hours as needed. For nausea, Disp: , Rfl:    pravastatin (PRAVACHOL) 40 MG tablet, Take 40 mg by mouth daily., Disp: , Rfl:    sulfamethoxazole-trimethoprim (BACTRIM DS) 800-160 MG per tablet, Take 1 tablet by mouth 2 (two) times daily., Disp: , Rfl:   Social History   Tobacco Use  Smoking Status Former  Smokeless Tobacco Not on file     Allergies  Allergen Reactions   Lactose Other (See Comments)   Lactose Intolerance (Gi)    Penicillins Other (See Comments)    unknown   Objective:  There were no vitals filed for this visit. There is no height or weight on file to calculate BMI. Constitutional Well developed. Well nourished.  Vascular Dorsalis pedis pulses palpable bilaterally. Posterior tibial pulses palpable bilaterally. Capillary refill normal to all digits.  No cyanosis or clubbing noted. Pedal hair growth normal.  Neurologic Normal speech. Oriented to person, place, and time. Epicritic sensation to light touch grossly present bilaterally.  Dermatologic Nails well groomed and normal in appearance. No open wounds. No skin lesions.  Orthopedic: Normal joint ROM without pain or crepitus bilaterally. No visible deformities. Tender to palpation at the calcaneal tuber left. No pain with calcaneal squeeze left. Ankle ROM diminished range of motion left. Silfverskiold Test: positive left.   Radiographs: Taken and reviewed. No acute fractures or dislocations. No evidence of stress fracture.  Plantar heel spur present. Posterior heel spur present.   Assessment:   1. Plantar fasciitis of left foot      Plan:  Patient was evaluated and treated and all questions answered.  Plantar Fasciitis, left - XR reviewed as above.  - Educated on icing  and stretching. Instructions given.  -Third injection delivered to the plantar fascia as below. - DME: Plantar Fascial Brace - Pharmacologic management: None -Power steps were dispensed from the office today.  Procedure: Injection Tendon/Ligament Location: Left plantar fascia at the glabrous junction; medial approach. Skin Prep: alcohol Injectate: 0.5 cc 0.5% marcaine plain, 0.5 cc of 1% Lidocaine, 0.5 cc kenalog 10. Disposition: Patient tolerated procedure well. Injection site dressed with a band-aid.  No follow-ups on file.

## 2021-11-16 ENCOUNTER — Ambulatory Visit: Payer: Medicare HMO | Admitting: Podiatry

## 2022-06-07 ENCOUNTER — Ambulatory Visit: Payer: Medicare HMO | Admitting: Registered Nurse

## 2022-06-07 ENCOUNTER — Ambulatory Visit
Admission: RE | Admit: 2022-06-07 | Discharge: 2022-06-07 | Disposition: A | Discharge status: Home / Self Care | Payer: Medicare HMO | Attending: Gastroenterology | Admitting: Gastroenterology

## 2022-06-07 ENCOUNTER — Encounter: Payer: Self-pay | Admitting: Gastroenterology

## 2022-06-07 ENCOUNTER — Encounter
Admission: RE | Disposition: A | Discharge status: Home / Self Care | Payer: Self-pay | Source: Home / Self Care | Attending: Gastroenterology

## 2022-06-07 ENCOUNTER — Other Ambulatory Visit: Payer: Self-pay

## 2022-06-07 DIAGNOSIS — K641 Second degree hemorrhoids: Secondary | ICD-10-CM | POA: Insufficient documentation

## 2022-06-07 DIAGNOSIS — Z86010 Personal history of colonic polyps: Secondary | ICD-10-CM | POA: Insufficient documentation

## 2022-06-07 DIAGNOSIS — Z1211 Encounter for screening for malignant neoplasm of colon: Secondary | ICD-10-CM | POA: Diagnosis not present

## 2022-06-07 DIAGNOSIS — J449 Chronic obstructive pulmonary disease, unspecified: Secondary | ICD-10-CM | POA: Diagnosis not present

## 2022-06-07 DIAGNOSIS — D122 Benign neoplasm of ascending colon: Secondary | ICD-10-CM | POA: Insufficient documentation

## 2022-06-07 DIAGNOSIS — E7800 Pure hypercholesterolemia, unspecified: Secondary | ICD-10-CM | POA: Insufficient documentation

## 2022-06-07 DIAGNOSIS — I10 Essential (primary) hypertension: Secondary | ICD-10-CM | POA: Diagnosis not present

## 2022-06-07 DIAGNOSIS — M1990 Unspecified osteoarthritis, unspecified site: Secondary | ICD-10-CM | POA: Insufficient documentation

## 2022-06-07 DIAGNOSIS — Z87891 Personal history of nicotine dependence: Secondary | ICD-10-CM | POA: Diagnosis not present

## 2022-06-07 DIAGNOSIS — K5730 Diverticulosis of large intestine without perforation or abscess without bleeding: Secondary | ICD-10-CM | POA: Diagnosis not present

## 2022-06-07 HISTORY — PX: COLONOSCOPY: SHX5424

## 2022-06-07 SURGERY — COLONOSCOPY
Anesthesia: General

## 2022-06-07 MED ORDER — LIDOCAINE HCL (CARDIAC) PF 100 MG/5ML IV SOSY
PREFILLED_SYRINGE | INTRAVENOUS | Status: DC | PRN
  Administered 2022-06-07: 60 mg via INTRAVENOUS

## 2022-06-07 MED ORDER — PROPOFOL 500 MG/50ML IV EMUL
INTRAVENOUS | Status: DC | PRN
  Administered 2022-06-07: 140 ug/kg/min via INTRAVENOUS

## 2022-06-07 MED ORDER — SODIUM CHLORIDE 0.9 % IV SOLN
INTRAVENOUS | Status: DC
Start: 2022-06-07 — End: 2022-06-07

## 2022-06-07 MED ORDER — PROPOFOL 10 MG/ML IV BOLUS
INTRAVENOUS | Status: DC | PRN
  Administered 2022-06-07: 70 mg via INTRAVENOUS

## 2022-06-07 NOTE — H&P (Signed)
Pre-Procedure H&P   Patient ID: Javier Good is a 76 y.o. male.  Gastroenterology Provider: Annamaria Helling, DO  Referring Provider: Laurine Blazer, PA PCP: Baxter Hire, MD  Date: 06/07/2022  HPI Javier Good is a 76 y.o. male who presents today for Colonoscopy for surveillance- phx colon polyps. Last colonoscopy performed in January 2020 with 3 tubular adenomas, pandiverticulosis and internal hemorrhoids.  He had a hyperplastic polyp in the rectum. Family history of colon cancer in his brother age 71.  Mother and father each with colon polyps.  Patient has had normal bowel movements.  Scant amounts of bright red blood with wiping but this has been unchanged for years.  No melena diarrhea or constipation.  He takes Aleve daily in addition to his aspirin.  He has a 40-pack-year history  Status post appendectomy  Most recent lab work hemoglobin 12.8 MCV 95 creatinine 1.4 platelets 215,000   Past Medical History:  Diagnosis Date   Arthritis    osteo   COPD (chronic obstructive pulmonary disease) ([place])    Hypercholesteremia    Hypertension    Thyroid goiter     Past Surgical History:  Procedure Laterality Date   APPENDECTOMY     BACK SURGERY     COLONOSCOPY WITH PROPOFOL N/A 01/12/2019   Procedure: COLONOSCOPY WITH PROPOFOL;  Surgeon: Lollie Sails, MD;  Location: [facility];  Service: Endoscopy;  Laterality: N/A;   left knee surgery  01/30/12    Family History Brother colon cancer age 45.  Mother and father colon polyps No other h/o GI disease or malignancy  Review of Systems  Constitutional:  Negative for activity change, appetite change, chills, diaphoresis, fatigue, fever and unexpected weight change.  HENT:  Negative for trouble swallowing and voice change.   Respiratory:  Negative for shortness of breath and wheezing.   Cardiovascular:  Negative for chest pain, palpitations and leg swelling.  Gastrointestinal:  Positive for blood in  stool (scant with wiping). Negative for abdominal distention, abdominal pain, anal bleeding, constipation, diarrhea, nausea and vomiting.  Musculoskeletal:  Negative for arthralgias and myalgias.  Skin:  Negative for color change and pallor.  Neurological:  Negative for dizziness, syncope and weakness.  Psychiatric/Behavioral:  Negative for confusion. The patient is not nervous/anxious.   All other systems reviewed and are negative.    Medications No current facility-administered medications on file prior to encounter.   Current Outpatient Medications on File Prior to Encounter  Medication Sig Dispense Refill   amLODipine (NORVASC) 5 MG tablet Take 5 mg by mouth daily.     levothyroxine (SYNTHROID) 75 MCG tablet Take 75 mcg by mouth daily before breakfast.     acetaminophen (TYLENOL) 500 MG tablet Take 1,000 mg by mouth every 6 (six) hours as needed. For pain     cephALEXin (KEFLEX) 500 MG capsule Take 500 mg by mouth every 8 (eight) hours.     Ferrous Sulfate (IRON) 28 MG TABS Take 1 tablet by mouth every morning.     gabapentin (NEURONTIN) 400 MG capsule Take 400 mg by mouth 2 (two) times daily.     HYDROcodone-acetaminophen (NORCO) 7.5-325 MG per tablet Take 1 tablet by mouth every 6 (six) hours as needed. For pain     ondansetron (ZOFRAN) 4 MG tablet Take 4 mg by mouth every 8 (eight) hours as needed. For nausea     pravastatin (PRAVACHOL) 40 MG tablet Take 40 mg by mouth daily.     sulfamethoxazole-trimethoprim (  BACTRIM DS) 800-160 MG per tablet Take 1 tablet by mouth 2 (two) times daily.      Pertinent medications related to GI and procedure were reviewed by me with the patient prior to the procedure  No current facility-administered medications for this encounter.      Allergies  Allergen Reactions   Lactose Other (See Comments)   Lactose Intolerance (Gi)    Penicillins Other (See Comments)    unknown   Allergies were reviewed by me prior to the procedure  Objective    Body mass index is 29.53 kg/m. Vitals:   06/07/22 1222  BP: 134/64  Pulse: 78  Resp: 20  Temp: (!) 96.6 F (35.9 C)  TempSrc: Temporal  SpO2: 97%  Weight: 90.7 kg  Height: '5\' 9"'$  (1.753 m)     Physical Exam Vitals and nursing note reviewed.  Constitutional:      General: He is not in acute distress.    Appearance: Normal appearance. He is not ill-appearing, toxic-appearing or diaphoretic.  HENT:     Head: Normocephalic and atraumatic.     Nose: Nose normal.     Mouth/Throat:     Mouth: Mucous membranes are moist.     Pharynx: Oropharynx is clear.  Eyes:     General: No scleral icterus.    Extraocular Movements: Extraocular movements intact.  Cardiovascular:     Rate and Rhythm: Normal rate and regular rhythm.     Heart sounds: Normal heart sounds. No murmur heard.    No friction rub. No gallop.  Pulmonary:     Effort: Pulmonary effort is normal. No respiratory distress.     Breath sounds: Normal breath sounds. No wheezing, rhonchi or rales.  Abdominal:     General: Bowel sounds are normal. There is no distension.     Palpations: Abdomen is soft.     Tenderness: There is no abdominal tenderness. There is no guarding or rebound.  Musculoskeletal:     Cervical back: Neck supple.     Right lower leg: No edema.     Left lower leg: No edema.  Skin:    General: Skin is warm and dry.     Coloration: Skin is not jaundiced or pale.  Neurological:     General: No focal deficit present.     Mental Status: He is alert and oriented to person, place, and time. Mental status is at baseline.  Psychiatric:        Mood and Affect: Mood normal.        Behavior: Behavior normal.        Thought Content: Thought content normal.        Judgment: Judgment normal.      Assessment:  Javier Good is a 76 y.o. male  who presents today for Colonoscopy for surveillance- phx colon polyps.  Plan:  Colonoscopy with possible intervention today  Colonoscopy with possible  biopsy, control of bleeding, polypectomy, and interventions as necessary has been discussed with the patient/patient representative. Informed consent was obtained from the patient/patient representative after explaining the indication, nature, and risks of the procedure including but not limited to death, bleeding, perforation, missed neoplasm/lesions, cardiorespiratory compromise, and reaction to medications. Opportunity for questions was given and appropriate answers were provided. Patient/patient representative has verbalized understanding is amenable to undergoing the procedure.   Annamaria Helling, DO  [facility]  Portions of the record may have been created with voice recognition software. Occasional wrong-word or 'sound-a-like' substitutions may  have occurred due to the inherent limitations of voice recognition software.  Read the chart carefully and recognize, using context, where substitutions may have occurred.

## 2022-06-07 NOTE — Op Note (Signed)
[facility] Gastroenterology Patient Name: Javier Good Procedure Date: 06/07/2022 12:16 PM MRN: [id number] Account #: [id number] Date of Birth: 1946/10/08 Admit Type: Outpatient Age: 76 Room: [facility] ENDO ROOM 1 Gender: Male Note Status: Finalized Instrument Name: Colonoscope [id number] Procedure:             Colonoscopy Indications:           High risk colon cancer surveillance: Personal history                         of colonic polyps Providers:             Rueben Bash, DO Referring MD:          Baxter Hire, MD (Referring MD) Medicines:             Monitored Anesthesia Care Complications:         No immediate complications. Estimated blood loss:                         Minimal. Procedure:             Pre-Anesthesia Assessment:                        - Prior to the procedure, a History and Physical was                         performed, and patient medications and allergies were                         reviewed. The patient is competent. The risks and                         benefits of the procedure and the sedation options and                         risks were discussed with the patient. All questions                         were answered and informed consent was obtained.                         Patient identification and proposed procedure were                         verified by the physician, the nurse, the anesthetist                         and the technician in the endoscopy suite. Mental                         Status Examination: alert and oriented. Airway                         Examination: normal oropharyngeal airway and neck                         mobility. Respiratory Examination: clear to  auscultation. CV Examination: RRR, no murmurs, no S3                         or S4. Prophylactic Antibiotics: The patient does not                         require prophylactic antibiotics. Prior                          Anticoagulants: The patient has taken no previous                         anticoagulant or antiplatelet agents. ASA Grade                         Assessment: III - A patient with severe systemic                         disease. After reviewing the risks and benefits, the                         patient was deemed in satisfactory condition to                         undergo the procedure. The anesthesia plan was to use                         monitored anesthesia care (MAC). Immediately prior to                         administration of medications, the patient was                         re-assessed for adequacy to receive sedatives. The                         heart rate, respiratory rate, oxygen saturations,                         blood pressure, adequacy of pulmonary ventilation, and                         response to care were monitored throughout the                         procedure. The physical status of the patient was                         re-assessed after the procedure.                        After obtaining informed consent, the colonoscope was                         passed under direct vision. Throughout the procedure,                         the patient's blood pressure, pulse, and oxygen  saturations were monitored continuously. The                         Colonoscope was introduced through the anus and                         advanced to the the terminal ileum, with                         identification of the appendiceal orifice and IC                         valve. The colonoscopy was performed without                         difficulty. The patient tolerated the procedure well.                         The quality of the bowel preparation was evaluated                         using the BBPS [facility] Bowel Preparation Scale) with                         scores of: Right Colon = 2 (minor amount of residual                         staining, small  fragments of stool and/or opaque                         liquid, but mucosa seen well), Transverse Colon = 3                         (entire mucosa seen well with no residual staining,                         small fragments of stool or opaque liquid) and Left                         Colon = 2 (minor amount of residual staining, small                         fragments of stool and/or opaque liquid, but mucosa                         seen well). The total BBPS score equals 7. The quality                         of the bowel preparation was good. The terminal ileum,                         ileocecal valve, appendiceal orifice, and rectum were                         photographed. Findings:      The perianal and digital rectal examinations were normal. Pertinent       negatives include normal sphincter  tone.      Multiple small and large-mouthed diverticula were found in the entire       colon. Estimated blood loss: none.      Non-bleeding internal hemorrhoids were found during retroflexion. The       hemorrhoids were Grade II (internal hemorrhoids that prolapse but reduce       spontaneously). Estimated blood loss: none.      A 1 to 2 mm polyp was found in the ascending colon. The polyp was       sessile. The polyp was removed with a jumbo cold forceps. Resection and       retrieval were complete. Estimated blood loss was minimal.      The exam was otherwise without abnormality on direct and retroflexion       views.      The terminal ileum appeared normal. Estimated blood loss: none. Impression:            - Diverticulosis in the entire examined colon.                        - Non-bleeding internal hemorrhoids.                        - One 1 to 2 mm polyp in the ascending colon, removed                         with a jumbo cold forceps. Resected and retrieved.                        - The examination was otherwise normal on direct and                         retroflexion views.                         - The examined portion of the ileum was normal. Recommendation:        - Discharge patient to home.                        - Resume previous diet.                        - Continue present medications.                        - Await pathology results.                        - Repeat colonoscopy for surveillance based on                         pathology results.                        - Return to referring physician as previously                         scheduled.                        - The findings and recommendations were discussed with  the patient. Procedure Code(s):     --- Professional ---                        [phone], Colonoscopy, flexible; with biopsy, single or                         multiple Diagnosis Code(s):     --- Professional ---                        Z86.010, Personal history of colonic polyps                        K64.1, Second degree hemorrhoids                        K63.5, Polyp of colon                        K57.30, Diverticulosis of large intestine without                         perforation or abscess without bleeding CPT copyright 2019 American Medical Association. All rights reserved. The codes documented in this report are preliminary and upon coder review may  be revised to meet current compliance requirements. Attending Participation:      I personally performed the entire procedure. Volney American, DO Annamaria Helling DO, DO 06/07/2022 1:35:38 PM This report has been signed electronically. Number of Addenda: 0 Note Initiated On: 06/07/2022 12:16 PM Scope Withdrawal Time: 0 hours 15 minutes 44 seconds  Total Procedure Duration: 0 hours 26 minutes 31 seconds  Estimated Blood Loss:  Estimated blood loss was minimal.      [facility]

## 2022-06-07 NOTE — Interval H&P Note (Signed)
History and Physical Interval Note: Preprocedure H&P from 06/07/22  was reviewed and there was no interval change after seeing and examining the patient.  Written consent was obtained from the patient after discussion of risks, benefits, and alternatives. Patient has consented to proceed with Colonoscopy with possible intervention   06/07/2022 12:28 PM  Javier Good  has presented today for surgery, with the diagnosis of History of colon polyps (Z86.010) Family history of colon cancer (Z80.0).  The various methods of treatment have been discussed with the patient and family. After consideration of risks, benefits and other options for treatment, the patient has consented to  Procedure(s): COLONOSCOPY (N/A) as a surgical intervention.  The patient's history has been reviewed, patient examined, no change in status, stable for surgery.  I have reviewed the patient's chart and labs.  Questions were answered to the patient's satisfaction.     Annamaria Helling

## 2022-06-07 NOTE — Transfer of Care (Signed)
Immediate Anesthesia Transfer of Care Note  Patient: Javier Good  Procedure(s) Performed: COLONOSCOPY  Patient Location: PACU  Anesthesia Type:General  Level of Consciousness: awake, alert  and oriented  Airway & Oxygen Therapy: Patient Spontanous Breathing  Post-op Assessment: Report given to RN and Post -op Vital signs reviewed and stable  Post vital signs: Reviewed and stable  Last Vitals:  Vitals Value Taken Time  BP 103/60 06/07/22 1334  Temp 35.8 C 06/07/22 1334  Pulse 69 06/07/22 1338  Resp 15 06/07/22 1338  SpO2 95 % 06/07/22 1338  Vitals shown include unvalidated device data.  Last Pain:  Vitals:   06/07/22 1334  TempSrc: Temporal  PainSc:          Complications: No notable events documented.

## 2022-06-08 ENCOUNTER — Encounter: Payer: Self-pay | Admitting: Gastroenterology

## 2022-06-08 LAB — SURGICAL PATHOLOGY

## 2022-09-06 ENCOUNTER — Other Ambulatory Visit: Payer: Self-pay | Admitting: Family Medicine

## 2022-09-06 DIAGNOSIS — N5089 Other specified disorders of the male genital organs: Secondary | ICD-10-CM

## 2022-09-11 ENCOUNTER — Ambulatory Visit
Admission: RE | Admit: 2022-09-11 | Discharge: 2022-09-11 | Disposition: A | Discharge status: Home / Self Care | Payer: Medicare HMO | Source: Ambulatory Visit | Attending: Family Medicine | Admitting: Family Medicine

## 2022-09-11 DIAGNOSIS — N5089 Other specified disorders of the male genital organs: Secondary | ICD-10-CM

## 2022-11-15 ENCOUNTER — Ambulatory Visit (LOCAL_COMMUNITY_HEALTH_CENTER): Payer: Medicare HMO

## 2022-11-15 DIAGNOSIS — Z23 Encounter for immunization: Secondary | ICD-10-CM

## 2022-11-15 DIAGNOSIS — Z7185 Encounter for immunization safety counseling: Secondary | ICD-10-CM

## 2022-11-15 NOTE — Progress Notes (Signed)
  Are you feeling sick today? No   Have you ever received a dose of COVID-19 Vaccine? [employer], [place], [place], [place], Other) Yes  If yes, which vaccine and how many doses?   Pfizer and 5 doses   Did you bring the vaccination record card or other documentation?  Yes   Do you have a health condition or are undergoing treatment that makes you moderately or severely immunocompromised? This would include, but not be limited to: cancer, HIV, organ transplant, immunosuppressive therapy/high-dose corticosteroids, or moderate/severe primary immunodeficiency.  No  Have you received COVID-19 vaccine before or during hematopoietic cell transplant (HCT) or CAR-T-cell therapies? No  Have you ever had an allergic reaction to: (This would include a severe allergic reaction or a reaction that caused hives, swelling, or respiratory distress, including wheezing.) A component of a COVID-19 vaccine or a previous dose of COVID-19 vaccine? No   Have you ever had an allergic reaction to another vaccine (other thanCOVID-19 vaccine) or an injectable medication? (This would include a severe allergic reaction or a reaction that caused hives, swelling, or respiratory distress, including wheezing.)   No    Do you have a history of any of the following:  Myocarditis or Pericarditis No  Dermal fillers:  No  Multisystem Inflammatory Syndrome (MIS-C or MIS-A)? No  COVID-19 disease within the past 3 months? No  Vaccinated with monkeypox vaccine in the last 4 weeks? No  Tolerated Pfizer Comirnaty [phone] and high dose flu vaccine well today. Stayed for 15 min observation without problem. Updated [facility] and covid card given. Josie Saunders, RN

## 2024-07-02 ENCOUNTER — Ambulatory Visit: Admitting: Urology

## 2024-07-02 VITALS — BP 115/55 | HR 70 | Ht 69.0 in | Wt 192.0 lb

## 2024-07-02 DIAGNOSIS — N5089 Other specified disorders of the male genital organs: Secondary | ICD-10-CM | POA: Diagnosis not present

## 2024-07-02 NOTE — Progress Notes (Signed)
   07/02/24 9:40 AM   Javier Good Javier Good May 29, 1946 [id number]  CC: Right scrotal/groin mass  HPI: 78 year old male who reports a right scrotal/groin mass.  This is nontender, not bothersome.  He is interested in further evaluation.  He thinks it has been present for at least a few months but potentially longer.  There is a note where he had an ultrasound in September 2023 when he reported the mass, but this was unable to be visualized on ultrasound.   PMH: Past Medical History:  Diagnosis Date   Arthritis    osteo   COPD (chronic obstructive pulmonary disease) ([facility])    Hypercholesteremia    Hypertension    Thyroid goiter     Surgical History: Past Surgical History:  Procedure Laterality Date   APPENDECTOMY     BACK SURGERY     COLONOSCOPY N/A 06/07/2022   Procedure: COLONOSCOPY;  Surgeon: Onita Elspeth Sharper, DO;  Location: [facility] ENDOSCOPY;  Service: Gastroenterology;  Laterality: N/A;   COLONOSCOPY WITH PROPOFOL  N/A 01/12/2019   Procedure: COLONOSCOPY WITH PROPOFOL ;  Surgeon: Gaylyn Gladis PENNER, MD;  Location: [facility] ENDOSCOPY;  Service: Endoscopy;  Laterality: N/A;   left knee surgery  01/30/12    Family History: No family history on file.  Social History:  reports that he has quit smoking. He does not have any smokeless tobacco history on file. He reports that he does not drink alcohol and does not use drugs.  Physical Exam: BP (!) 115/55 (BP Location: Left Arm, Patient Position: Sitting, Cuff Size: Normal)   Pulse 70   Ht 5' 9 (1.753 m)   Wt 192 lb (87.1 kg)   SpO2 98%   BMI 28.35 kg/m    Constitutional:  Alert and oriented, No acute distress. Cardiovascular: No clubbing, cyanosis, or edema. Respiratory: Normal respiratory effort, no increased work of breathing. GI: Abdomen is soft, nontender, nondistended, no abdominal masses GU: Testicles 20 cc of descended bilaterally without masses, there is a firm 2 cm spherical lesion in the right groin that appears to be  involved with the spermatic cord, can be pulled down into the scrotum easily, nontender  Assessment & Plan:   78 year old male with 2 cm nontender mass within the right upper scrotum/groin.  We discussed possible etiologies and I recommended a scrotal ultrasound for further evaluation we will call with those results  Scrotal ultrasound, call with results   Redell Burnet, MD 07/02/2024  [facility] [address] [place], [place] [address] [phone]

## 2024-07-03 ENCOUNTER — Ambulatory Visit
Admission: RE | Admit: 2024-07-03 | Discharge: 2024-07-03 | Disposition: A | Discharge status: Home / Self Care | Source: Ambulatory Visit | Attending: Urology | Admitting: Urology

## 2024-07-03 DIAGNOSIS — N5089 Other specified disorders of the male genital organs: Secondary | ICD-10-CM | POA: Diagnosis present

## 2024-07-13 ENCOUNTER — Ambulatory Visit: Payer: Self-pay | Admitting: Urology

## 2024-07-13 DIAGNOSIS — N509 Disorder of male genital organs, unspecified: Secondary | ICD-10-CM

## 2024-07-14 NOTE — Telephone Encounter (Signed)
Called pt informed him of the information below. Pt voiced understanding. CT ordered.

## 2024-07-14 NOTE — Telephone Encounter (Signed)
-----   Message from Redell JAYSON Burnet sent at 07/13/2024 12:40 PM EDT ----- Ultrasound does show an indeterminate lesion within the right groin like we saw in clinic.  They recommended a CT for further evaluation.  Please order CT abdomen and pelvis with and without contrast  for further evaluation of groin lesion, we will call with those results  Redell Burnet, MD 07/13/2024  ----- Message ----- From: Interface, Rad Results In Sent: 07/10/2024   9:21 AM EDT To: Redell JAYSON Burnet, MD

## 2024-07-28 ENCOUNTER — Ambulatory Visit
Admission: RE | Admit: 2024-07-28 | Discharge: 2024-07-28 | Disposition: A | Discharge status: Home / Self Care | Source: Ambulatory Visit | Attending: Urology | Admitting: Urology

## 2024-07-28 DIAGNOSIS — I2510 Atherosclerotic heart disease of native coronary artery without angina pectoris: Secondary | ICD-10-CM | POA: Diagnosis not present

## 2024-07-28 DIAGNOSIS — N401 Enlarged prostate with lower urinary tract symptoms: Secondary | ICD-10-CM | POA: Diagnosis not present

## 2024-07-28 DIAGNOSIS — K5730 Diverticulosis of large intestine without perforation or abscess without bleeding: Secondary | ICD-10-CM | POA: Diagnosis not present

## 2024-07-28 DIAGNOSIS — N509 Disorder of male genital organs, unspecified: Secondary | ICD-10-CM | POA: Insufficient documentation

## 2024-07-28 DIAGNOSIS — K4090 Unilateral inguinal hernia, without obstruction or gangrene, not specified as recurrent: Secondary | ICD-10-CM | POA: Diagnosis not present

## 2024-07-28 DIAGNOSIS — I700 Atherosclerosis of aorta: Secondary | ICD-10-CM | POA: Diagnosis not present

## 2024-07-28 LAB — POCT I-STAT CREATININE: Creatinine, Ser: 1.7 mg/dL — ABNORMAL HIGH (ref 0.61–1.24)

## 2024-07-28 MED ORDER — IOHEXOL 300 MG/ML  SOLN
100.0000 mL | Freq: Once | INTRAMUSCULAR | Status: AC | PRN
Start: 2024-07-28 — End: 2024-07-28
  Administered 2024-07-28 (×2): 80 mL via INTRAVENOUS

## 2024-08-03 ENCOUNTER — Ambulatory Visit: Payer: Self-pay | Admitting: Urology

## 2024-08-03 NOTE — Telephone Encounter (Signed)
 Called pt's home number, no answer. Unable to leave message as no voicemail is set up.   Called pt's mobile number, no answer. Left general message for pt to call back as DPR does not include mobile number.

## 2024-08-03 NOTE — Telephone Encounter (Signed)
-----   Message from Redell JAYSON Burnet sent at 08/03/2024  2:52 PM EDT ----- Good news, I reviewed your CT scan and no worrisome findings.  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  Please schedule follow-up in 4 months for repeat exam to confirm stability  Redell Burnet, MD 08/03/2024  ----- Message ----- From: Interface, Rad Results In Sent: 08/01/2024   9:46 AM EDT To: Redell JAYSON Burnet, MD

## 2024-08-05 NOTE — Telephone Encounter (Signed)
Called pt informed him of the information below. Pt voiced understanding. Appt scheduled.

## 2024-12-23 ENCOUNTER — Encounter: Payer: Self-pay | Admitting: Urology

## 2024-12-23 ENCOUNTER — Ambulatory Visit: Admitting: Urology

## 2024-12-23 VITALS — BP 143/75 | HR 73 | Ht 69.0 in | Wt 198.0 lb

## 2024-12-23 DIAGNOSIS — N5089 Other specified disorders of the male genital organs: Secondary | ICD-10-CM | POA: Diagnosis not present

## 2024-12-23 DIAGNOSIS — N529 Male erectile dysfunction, unspecified: Secondary | ICD-10-CM

## 2024-12-23 MED ORDER — TADALAFIL 20 MG PO TABS: ORAL_TABLET | ORAL | 3 refills | Status: AC

## 2024-12-23 NOTE — Progress Notes (Signed)
 "    12/23/2024 3:21 PM   Javier Good 1946/05/29 [id number]  Referring provider: Rudolpho Norleen BIRCH, MD [address] [facility] [address] [facility] [place],  [place] [address]  Urological history: 1.     Chief Complaint  Patient presents with   Scrotal mass   Follow-up    HPI: Javier Good is a 79 y.o. man who presents today for 4 month follow up.   Previous records reviewed.  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  He states it is not causing him any pain.  He also has been having issues with erectile dysfunction.  He had tried Viagra in the remote past and had some effectiveness with it, but he was not completely satisfied with the results.  He no longer gets substantial spontaneous erections and buys it difficult to achieve erection for penetration.  PMH: Past Medical History:  Diagnosis Date   Arthritis    osteo   COPD (chronic obstructive pulmonary disease) (HCC)    Hypercholesteremia    Hypertension    Thyroid goiter     Surgical History: Past Surgical History:  Procedure Laterality Date   APPENDECTOMY     BACK SURGERY     COLONOSCOPY N/A 06/07/2022   Procedure: COLONOSCOPY;  Surgeon: Onita Elspeth Sharper, DO;  Location: [facility] ENDOSCOPY;  Service: Gastroenterology;  Laterality: N/A;   COLONOSCOPY WITH PROPOFOL  N/A 01/12/2019   Procedure: COLONOSCOPY WITH PROPOFOL ;  Surgeon: Gaylyn Gladis PENNER, MD;  Location: [facility] ENDOSCOPY;  Service: Endoscopy;  Laterality: N/A;   left knee surgery  01/30/12    Home Medications:  Allergies as of 12/23/2024       Reactions   Lactose Other (See Comments)   Lactose Intolerance (gi)    Penicillins Other (See Comments)   unknown        Medication List        Accurate as of December 23, 2024  3:21 PM. If you have any questions, ask your nurse or doctor.          STOP taking these medications    HYDROcodone-acetaminophen 7.5-325 MG tablet Commonly known as: NORCO       TAKE  these medications    acetaminophen 500 MG tablet Commonly known as: TYLENOL Take 1,000 mg by mouth every 6 (six) hours as needed. For pain   amLODipine 5 MG tablet Commonly known as: NORVASC Take 5 mg by mouth daily.   levothyroxine 75 MCG tablet Commonly known as: SYNTHROID Take 75 mcg by mouth every morning.   losartan 25 MG tablet Commonly known as: COZAAR Take 25 mg by mouth daily.   pravastatin 40 MG tablet Commonly known as: PRAVACHOL Take 40 mg by mouth daily.   tadalafil  20 MG tablet Commonly known as: CIALIS  Take 1 to 2 tablets as needed 30 minutes prior to intercourse        Allergies: Allergies[1]  Family History: No family history on file.  Social History:  reports that he has quit smoking. He does not have any smokeless tobacco history on file. He reports that he does not drink alcohol and does not use drugs.  ROS: Pertinent ROS in HPI  Physical Exam: BP (!) 143/75   Pulse 73   Ht 5' 9 (1.753 m)   Wt 198 lb (89.8 kg)   SpO2 96%   BMI 29.24 kg/m   Constitutional:  Well nourished. Alert and oriented, No acute distress. HEENT: [place] AT, moist mucus  membranes.  Trachea midline Cardiovascular: No clubbing, cyanosis, or edema. Respiratory: Normal respiratory effort, no increased work of breathing. GU: No CVA tenderness.  No bladder fullness or masses.  Patient with uncircumcised phallus. Foreskin easily retracted  Urethral meatus is patent.  No penile discharge. No penile lesions or rashes. Scrotum without lesions, cysts, rashes and/or edema.  Testicles are located scrotally bilaterally. No masses are appreciated in the testicles. Left and right epididymis are normal.  There is a firm 2 cm spherical lesion in the right groin.  It is nontender.  It is stable from previous exam. Neurologic: Grossly intact, no focal deficits, moving all 4 extremities. Psychiatric: Normal mood and affect.  Laboratory Data: See Epic and HPI   I have reviewed the  labs.   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 Erectile dysfunction - He has not had a trial of Cialis  - Will go ahead and prescribe Cialis  20 mg, 1 to 2 tablets prior to intercourse, he denies any nitrate use   Return in about 1 year (around 12/23/2025) for SHIM and repeat exam .  These notes generated with voice recognition software. I apologize for typographical errors.  [name]  [facility] [address]  [address] [place], [place] [address] [phone]     [1]  Allergies Allergen Reactions   Lactose Other (See Comments)   Lactose Intolerance (Gi)    Penicillins Other (See Comments)    unknown   "

## 2025-12-23 ENCOUNTER — Ambulatory Visit: Admitting: Urology
